# Patient Record
Sex: Female | Born: 1995 | Race: Black or African American | Hispanic: No | Marital: Single | Smoking: Never smoker
Health system: Southern US, Community
[De-identification: ages and names within clinical notes are randomized; demographics above are authoritative.]

## PROBLEM LIST (undated history)

## (undated) DIAGNOSIS — A6000 Herpesviral infection of urogenital system, unspecified: Secondary | ICD-10-CM

---
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
